# Patient Record
Sex: Female | Born: 1995
Health system: Southern US, Community
[De-identification: ages and names within clinical notes are randomized; demographics above are authoritative.]

## PROBLEM LIST (undated history)

## (undated) HISTORY — PX: GUM SURGERY: SHX658

---
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
## 2015-01-10 ENCOUNTER — Ambulatory Visit: Payer: 59 | Admitting: Sports Medicine

## 2015-12-02 DIAGNOSIS — H5213 Myopia, bilateral: Secondary | ICD-10-CM | POA: Diagnosis not present

## 2017-03-01 ENCOUNTER — Encounter: Payer: Self-pay | Admitting: *Deleted

## 2017-03-01 DIAGNOSIS — Z23 Encounter for immunization: Secondary | ICD-10-CM

## 2017-03-15 DIAGNOSIS — H5213 Myopia, bilateral: Secondary | ICD-10-CM | POA: Diagnosis not present

## 2017-04-29 DIAGNOSIS — R112 Nausea with vomiting, unspecified: Secondary | ICD-10-CM | POA: Diagnosis not present

## 2018-02-02 DIAGNOSIS — L03032 Cellulitis of left toe: Secondary | ICD-10-CM | POA: Diagnosis not present

## 2018-03-18 ENCOUNTER — Encounter: Payer: Self-pay | Admitting: *Deleted

## 2018-05-12 DIAGNOSIS — Z309 Encounter for contraceptive management, unspecified: Secondary | ICD-10-CM | POA: Diagnosis not present

## 2018-05-12 DIAGNOSIS — Z30012 Encounter for prescription of emergency contraception: Secondary | ICD-10-CM | POA: Diagnosis not present

## 2018-05-12 DIAGNOSIS — Z3041 Encounter for surveillance of contraceptive pills: Secondary | ICD-10-CM | POA: Diagnosis not present

## 2019-01-02 DIAGNOSIS — K130 Diseases of lips: Secondary | ICD-10-CM | POA: Diagnosis not present

## 2019-01-02 DIAGNOSIS — L700 Acne vulgaris: Secondary | ICD-10-CM | POA: Diagnosis not present

## 2019-01-02 DIAGNOSIS — L508 Other urticaria: Secondary | ICD-10-CM | POA: Diagnosis not present

## 2019-03-05 DIAGNOSIS — L700 Acne vulgaris: Secondary | ICD-10-CM | POA: Diagnosis not present

## 2019-03-05 DIAGNOSIS — K130 Diseases of lips: Secondary | ICD-10-CM | POA: Diagnosis not present

## 2019-03-25 DIAGNOSIS — Z20828 Contact with and (suspected) exposure to other viral communicable diseases: Secondary | ICD-10-CM | POA: Diagnosis not present

## 2019-04-07 DIAGNOSIS — Z113 Encounter for screening for infections with a predominantly sexual mode of transmission: Secondary | ICD-10-CM | POA: Diagnosis not present

## 2019-04-07 DIAGNOSIS — Z01419 Encounter for gynecological examination (general) (routine) without abnormal findings: Secondary | ICD-10-CM | POA: Diagnosis not present

## 2019-05-05 DIAGNOSIS — L700 Acne vulgaris: Secondary | ICD-10-CM | POA: Diagnosis not present

## 2019-08-27 DIAGNOSIS — L271 Localized skin eruption due to drugs and medicaments taken internally: Secondary | ICD-10-CM | POA: Diagnosis not present

## 2019-09-17 DIAGNOSIS — L271 Localized skin eruption due to drugs and medicaments taken internally: Secondary | ICD-10-CM | POA: Diagnosis not present

## 2019-09-17 DIAGNOSIS — L818 Other specified disorders of pigmentation: Secondary | ICD-10-CM | POA: Diagnosis not present

## 2019-11-19 ENCOUNTER — Ambulatory Visit (HOSPITAL_COMMUNITY)
Admission: EM | Admit: 2019-11-19 | Discharge: 2019-11-19 | Disposition: A | Discharge status: Home / Self Care | Payer: 59 | Attending: Family Medicine | Admitting: Family Medicine

## 2019-11-19 ENCOUNTER — Encounter (HOSPITAL_COMMUNITY): Payer: Self-pay

## 2019-11-19 ENCOUNTER — Other Ambulatory Visit: Payer: Self-pay

## 2019-11-19 DIAGNOSIS — R197 Diarrhea, unspecified: Secondary | ICD-10-CM | POA: Diagnosis not present

## 2019-11-19 LAB — POCT URINALYSIS DIP (DEVICE)
Bilirubin Urine: NEGATIVE
Glucose, UA: NEGATIVE mg/dL
Hgb urine dipstick: NEGATIVE
Ketones, ur: NEGATIVE mg/dL
Nitrite: NEGATIVE
Protein, ur: NEGATIVE mg/dL
Specific Gravity, Urine: 1.025 (ref 1.005–1.030)
Urobilinogen, UA: 0.2 mg/dL (ref 0.0–1.0)
pH: 5.5 (ref 5.0–8.0)

## 2019-11-19 MED ORDER — ONDANSETRON 4 MG PO TBDP: 4.0000 mg | ORAL_TABLET | Freq: Three times a day (TID) | ORAL | 0 refills | Status: AC | PRN

## 2019-11-19 NOTE — ED Triage Notes (Signed)
Pt c/o N/D, indisgestionx6days. Pt states she has had 6 bouts of diarrhea in the past 6 days.

## 2019-11-19 NOTE — ED Provider Notes (Signed)
[facility]    CSN: [id number] Arrival date & time: 11/19/19  4098      History   Chief Complaint Chief Complaint  Patient presents with  . Diarrhea    HPI Julie Nichols is a 24 y.o. female.   Pt is overall healthy 24 year old female presenting with intermittent episodes of diarrhea x 6 days. Occasionally nauseous after eating a meal. Diarrhea improved Monday/Tuesday but worse the last 2 days. Improved with diet change. Was recently in [place] and eating a different diet.  Minimal relief from imodium. Denies fevers, vomiting, abdominal pain, pelvic pain, vaginal discharge, urinary symptoms, or back pain. Has not been around anyone with similar symptoms.  No recent travel or antibiotic use.  No fever. No current abdominal pain.   ROS per HPI      History reviewed. No pertinent past medical history.  There are no problems to display for this patient.   Past Surgical History:  Procedure Laterality Date  . GUM SURGERY      OB History   No obstetric history on file.      Home Medications    Prior to Admission medications   Medication Sig Start Date End Date Taking? Authorizing Provider  LORYNA 3-0.02 MG tablet  09/28/19   [provider]  ondansetron (ZOFRAN ODT) 4 MG disintegrating tablet Take 1 tablet (4 mg total) by mouth every 8 (eight) hours as needed for nausea or vomiting. 11/19/19   Janace Aris, NP    Family History Family History  Problem Relation Age of Onset  . Cancer Mother   . Diabetes Mother   . Cancer Father     Social History Social History   Tobacco Use  . Smoking status: Never Smoker  . Smokeless tobacco: Never Used  Substance Use Topics  . Alcohol use: Yes    Comment: occ  . Drug use: No     Allergies   Bactrim [sulfamethoxazole-trimethoprim]   Review of Systems Review of Systems  Constitutional: Negative for fatigue, fever and unexpected weight change.  Respiratory: Negative.   Cardiovascular:  Negative.   Gastrointestinal: Positive for diarrhea and nausea. Negative for abdominal distention, abdominal pain, blood in stool, constipation and vomiting.  Endocrine: Negative.   Genitourinary: Negative for decreased urine volume, difficulty urinating, flank pain, hematuria, pelvic pain, urgency, vaginal bleeding, vaginal discharge and vaginal pain.  Musculoskeletal: Negative.   Skin: Negative.      Physical Exam Triage Vital Signs ED Triage Vitals  Enc Vitals Group     BP 11/19/19 0858 101/63     Pulse Rate 11/19/19 0858 71     Resp 11/19/19 0858 16     Temp 11/19/19 0858 98.4 F (36.9 C)     Temp Source 11/19/19 0858 Oral     SpO2 11/19/19 0858 100 %     Weight 11/19/19 0909 100 lb (45.4 kg)     Height 11/19/19 0909 4\' 11"  (1.499 m)     Head Circumference --      Peak Flow --      Pain Score 11/19/19 0909 0     Pain Loc --      Pain Edu? --      Excl. in GC? --    No data found.  Updated Vital Signs BP 101/63 (BP Location: Left Arm)   Pulse 71   Temp 98.4 F (36.9 C) (Oral)   Resp 16   Ht 4\' 11"  (1.499 m)   Wt 100 lb (  45.4 kg)   SpO2 100%   BMI 20.20 kg/m   Visual Acuity Right Eye Distance:   Left Eye Distance:   Bilateral Distance:    Right Eye Near:   Left Eye Near:    Bilateral Near:     Physical Exam Constitutional:      General: She is not in acute distress.    Appearance: Normal appearance. She is normal weight.  HENT:     Head: Normocephalic.  Cardiovascular:     Rate and Rhythm: Normal rate and regular rhythm.  Pulmonary:     Effort: Pulmonary effort is normal.  Abdominal:     General: Abdomen is flat. Bowel sounds are normal. There is no distension.     Palpations: Abdomen is soft. There is no mass.     Tenderness: There is no abdominal tenderness. There is no guarding or rebound.     Hernia: No hernia is present.  Musculoskeletal:        General: Normal range of motion.  Skin:    General: Skin is warm and dry.  Neurological:      General: No focal deficit present.     Mental Status: She is alert.  Psychiatric:        Mood and Affect: Mood normal.        Behavior: Behavior normal.      UC Treatments / Results  Labs (all labs ordered are listed, but only abnormal results are displayed) Labs Reviewed  POCT URINALYSIS DIP (DEVICE) - Abnormal; Notable for the following components:      Result Value   Leukocytes,Ua SMALL (*)    All other components within normal limits  URINE CULTURE    EKG   Radiology No results found.  Procedures Procedures (including critical care time)  Medications Ordered in UC Medications - No data to display  Initial Impression / Assessment and Plan / UC Course  I have reviewed the triage vital signs and the nursing notes.  Pertinent labs & imaging results that were available during my care of the patient were reviewed by me and considered in my medical decision making (see chart for details).     Diarrhea -most likely diet related.  Patient is not currently having any abdominal pain in the diarrhea seems to somewhat improved with diet change. Zofran given to use as needed for nausea, vomiting. Recommended patient she is staying hydrated Contact given for GI specialist for follow-up. Final Clinical Impressions(s) / UC Diagnoses   Final diagnoses:  Diarrhea, unspecified type     Discharge Instructions     Information put in your discharge instructions about food choices to help with diarrhea Believe this may be diet related. If the diarrhea worsens or you start developing abdominal pain please follow-up Zofran as needed for nausea, vomiting.  Be aware this medicine may make you constipated. Make sure you are drinking plenty of fluids and staying hydrated Follow up as needed for continued or worsening symptoms Contact given for GI specialist for follow-up as needed     ED Prescriptions    Medication Sig Dispense Auth. Provider   ondansetron (ZOFRAN ODT) 4 MG  disintegrating tablet Take 1 tablet (4 mg total) by mouth every 8 (eight) hours as needed for nausea or vomiting. 20 tablet Loura Halt A, NP     PDMP not reviewed this encounter.   Orvan July, NP 11/19/19 1109

## 2019-11-19 NOTE — Discharge Instructions (Signed)
Information put in your discharge instructions about food choices to help with diarrhea Believe this may be diet related. If the diarrhea worsens or you start developing abdominal pain please follow-up Zofran as needed for nausea, vomiting.  Be aware this medicine may make you constipated. Make sure you are drinking plenty of fluids and staying hydrated Follow up as needed for continued or worsening symptoms Contact given for GI specialist for follow-up as needed

## 2019-11-20 LAB — URINE CULTURE: Culture: NO GROWTH

## 2020-02-08 DIAGNOSIS — R194 Change in bowel habit: Secondary | ICD-10-CM | POA: Diagnosis not present

## 2020-02-08 DIAGNOSIS — R140 Abdominal distension (gaseous): Secondary | ICD-10-CM | POA: Diagnosis not present

## 2020-02-08 DIAGNOSIS — R197 Diarrhea, unspecified: Secondary | ICD-10-CM | POA: Diagnosis not present

## 2020-02-18 DIAGNOSIS — Z20822 Contact with and (suspected) exposure to covid-19: Secondary | ICD-10-CM | POA: Diagnosis not present

## 2020-03-18 DIAGNOSIS — R197 Diarrhea, unspecified: Secondary | ICD-10-CM | POA: Diagnosis not present

## 2020-04-07 DIAGNOSIS — L710 Perioral dermatitis: Secondary | ICD-10-CM | POA: Diagnosis not present

## 2020-04-07 DIAGNOSIS — L818 Other specified disorders of pigmentation: Secondary | ICD-10-CM | POA: Diagnosis not present

## 2020-04-07 DIAGNOSIS — L700 Acne vulgaris: Secondary | ICD-10-CM | POA: Diagnosis not present

## 2020-05-26 DIAGNOSIS — L710 Perioral dermatitis: Secondary | ICD-10-CM | POA: Diagnosis not present

## 2020-05-26 DIAGNOSIS — L818 Other specified disorders of pigmentation: Secondary | ICD-10-CM | POA: Diagnosis not present

## 2020-05-26 DIAGNOSIS — L700 Acne vulgaris: Secondary | ICD-10-CM | POA: Diagnosis not present

## 2020-05-26 DIAGNOSIS — L0292 Furuncle, unspecified: Secondary | ICD-10-CM | POA: Diagnosis not present

## 2020-07-05 DIAGNOSIS — L700 Acne vulgaris: Secondary | ICD-10-CM | POA: Diagnosis not present

## 2020-07-05 DIAGNOSIS — L710 Perioral dermatitis: Secondary | ICD-10-CM | POA: Diagnosis not present

## 2020-07-05 DIAGNOSIS — L818 Other specified disorders of pigmentation: Secondary | ICD-10-CM | POA: Diagnosis not present

## 2020-07-05 DIAGNOSIS — L0292 Furuncle, unspecified: Secondary | ICD-10-CM | POA: Diagnosis not present

## 2020-09-20 DIAGNOSIS — L700 Acne vulgaris: Secondary | ICD-10-CM | POA: Diagnosis not present

## 2020-09-20 DIAGNOSIS — L0292 Furuncle, unspecified: Secondary | ICD-10-CM | POA: Diagnosis not present

## 2020-09-20 DIAGNOSIS — L710 Perioral dermatitis: Secondary | ICD-10-CM | POA: Diagnosis not present

## 2020-10-17 DIAGNOSIS — Z20828 Contact with and (suspected) exposure to other viral communicable diseases: Secondary | ICD-10-CM | POA: Diagnosis not present

## 2020-11-04 DIAGNOSIS — R300 Dysuria: Secondary | ICD-10-CM | POA: Diagnosis not present

## 2020-11-04 DIAGNOSIS — J039 Acute tonsillitis, unspecified: Secondary | ICD-10-CM | POA: Diagnosis not present

## 2020-11-07 DIAGNOSIS — N760 Acute vaginitis: Secondary | ICD-10-CM | POA: Diagnosis not present

## 2020-11-07 DIAGNOSIS — J039 Acute tonsillitis, unspecified: Secondary | ICD-10-CM | POA: Diagnosis not present

## 2020-11-07 DIAGNOSIS — R300 Dysuria: Secondary | ICD-10-CM | POA: Diagnosis not present

## 2020-11-09 DIAGNOSIS — J039 Acute tonsillitis, unspecified: Secondary | ICD-10-CM | POA: Diagnosis not present

## 2020-12-21 DIAGNOSIS — A609 Anogenital herpesviral infection, unspecified: Secondary | ICD-10-CM | POA: Diagnosis not present

## 2021-01-14 DIAGNOSIS — H1045 Other chronic allergic conjunctivitis: Secondary | ICD-10-CM | POA: Diagnosis not present

## 2021-01-16 DIAGNOSIS — F419 Anxiety disorder, unspecified: Secondary | ICD-10-CM | POA: Diagnosis not present

## 2021-01-16 DIAGNOSIS — F321 Major depressive disorder, single episode, moderate: Secondary | ICD-10-CM | POA: Diagnosis not present

## 2021-01-16 DIAGNOSIS — F900 Attention-deficit hyperactivity disorder, predominantly inattentive type: Secondary | ICD-10-CM | POA: Diagnosis not present

## 2021-02-15 DIAGNOSIS — F321 Major depressive disorder, single episode, moderate: Secondary | ICD-10-CM | POA: Diagnosis not present

## 2021-02-15 DIAGNOSIS — F900 Attention-deficit hyperactivity disorder, predominantly inattentive type: Secondary | ICD-10-CM | POA: Diagnosis not present

## 2021-02-15 DIAGNOSIS — F419 Anxiety disorder, unspecified: Secondary | ICD-10-CM | POA: Diagnosis not present

## 2021-02-28 DIAGNOSIS — Z20828 Contact with and (suspected) exposure to other viral communicable diseases: Secondary | ICD-10-CM | POA: Diagnosis not present

## 2021-03-15 DIAGNOSIS — F419 Anxiety disorder, unspecified: Secondary | ICD-10-CM | POA: Diagnosis not present

## 2021-03-15 DIAGNOSIS — F321 Major depressive disorder, single episode, moderate: Secondary | ICD-10-CM | POA: Diagnosis not present

## 2021-03-15 DIAGNOSIS — F900 Attention-deficit hyperactivity disorder, predominantly inattentive type: Secondary | ICD-10-CM | POA: Diagnosis not present

## 2021-04-10 DIAGNOSIS — F900 Attention-deficit hyperactivity disorder, predominantly inattentive type: Secondary | ICD-10-CM | POA: Diagnosis not present

## 2021-04-10 DIAGNOSIS — F419 Anxiety disorder, unspecified: Secondary | ICD-10-CM | POA: Diagnosis not present

## 2021-04-10 DIAGNOSIS — F321 Major depressive disorder, single episode, moderate: Secondary | ICD-10-CM | POA: Diagnosis not present

## 2021-05-09 DIAGNOSIS — L02425 Furuncle of right lower limb: Secondary | ICD-10-CM | POA: Diagnosis not present

## 2021-05-09 DIAGNOSIS — L700 Acne vulgaris: Secondary | ICD-10-CM | POA: Diagnosis not present

## 2021-05-09 DIAGNOSIS — L02426 Furuncle of left lower limb: Secondary | ICD-10-CM | POA: Diagnosis not present

## 2021-05-09 DIAGNOSIS — B001 Herpesviral vesicular dermatitis: Secondary | ICD-10-CM | POA: Diagnosis not present

## 2021-05-24 DIAGNOSIS — Z20828 Contact with and (suspected) exposure to other viral communicable diseases: Secondary | ICD-10-CM | POA: Diagnosis not present

## 2022-03-05 DIAGNOSIS — N898 Other specified noninflammatory disorders of vagina: Secondary | ICD-10-CM | POA: Diagnosis not present

## 2022-03-05 DIAGNOSIS — Z01419 Encounter for gynecological examination (general) (routine) without abnormal findings: Secondary | ICD-10-CM | POA: Diagnosis not present

## 2022-03-05 DIAGNOSIS — Z113 Encounter for screening for infections with a predominantly sexual mode of transmission: Secondary | ICD-10-CM | POA: Diagnosis not present
